# Patient Record
Sex: Male | Born: 1985 | Race: White | Hispanic: No | Marital: Single | Smoking: Current every day smoker
Health system: Southern US, Community
[De-identification: ages and names within clinical notes are randomized; demographics above are authoritative.]

---

## 2006-04-12 ENCOUNTER — Emergency Department (HOSPITAL_COMMUNITY): Admission: EM | Admit: 2006-04-12 | Discharge: 2006-04-12 | Payer: Self-pay | Admitting: Emergency Medicine

## 2006-04-20 ENCOUNTER — Emergency Department (HOSPITAL_COMMUNITY): Admission: EM | Admit: 2006-04-20 | Discharge: 2006-04-20 | Payer: Self-pay | Admitting: Emergency Medicine

## 2020-11-21 ENCOUNTER — Encounter
Admission: EM | Disposition: A | Discharge status: Home / Self Care | Payer: Self-pay | Source: Home / Self Care | Attending: General Surgery

## 2020-11-21 ENCOUNTER — Emergency Department: Payer: BC Managed Care – PPO

## 2020-11-21 ENCOUNTER — Other Ambulatory Visit: Payer: Self-pay

## 2020-11-21 ENCOUNTER — Inpatient Hospital Stay
Admission: EM | Admit: 2020-11-21 | Discharge: 2020-11-23 | DRG: 340 | Disposition: A | Discharge status: Home / Self Care | Payer: BC Managed Care – PPO | Attending: General Surgery | Admitting: General Surgery

## 2020-11-21 ENCOUNTER — Observation Stay: Payer: BC Managed Care – PPO | Admitting: Anesthesiology

## 2020-11-21 ENCOUNTER — Encounter: Payer: Self-pay | Admitting: Emergency Medicine

## 2020-11-21 DIAGNOSIS — E876 Hypokalemia: Secondary | ICD-10-CM | POA: Diagnosis present

## 2020-11-21 DIAGNOSIS — R935 Abnormal findings on diagnostic imaging of other abdominal regions, including retroperitoneum: Secondary | ICD-10-CM | POA: Diagnosis not present

## 2020-11-21 DIAGNOSIS — R1031 Right lower quadrant pain: Secondary | ICD-10-CM

## 2020-11-21 DIAGNOSIS — Z6823 Body mass index (BMI) 23.0-23.9, adult: Secondary | ICD-10-CM

## 2020-11-21 DIAGNOSIS — K3521 Acute appendicitis with generalized peritonitis, with abscess: Secondary | ICD-10-CM | POA: Diagnosis present

## 2020-11-21 DIAGNOSIS — F419 Anxiety disorder, unspecified: Secondary | ICD-10-CM | POA: Diagnosis present

## 2020-11-21 DIAGNOSIS — D72829 Elevated white blood cell count, unspecified: Secondary | ICD-10-CM

## 2020-11-21 DIAGNOSIS — K3580 Unspecified acute appendicitis: Secondary | ICD-10-CM | POA: Diagnosis present

## 2020-11-21 DIAGNOSIS — K381 Appendicular concretions: Secondary | ICD-10-CM | POA: Diagnosis present

## 2020-11-21 DIAGNOSIS — E669 Obesity, unspecified: Secondary | ICD-10-CM | POA: Diagnosis present

## 2020-11-21 DIAGNOSIS — R1084 Generalized abdominal pain: Secondary | ICD-10-CM | POA: Diagnosis not present

## 2020-11-21 DIAGNOSIS — Z20822 Contact with and (suspected) exposure to covid-19: Secondary | ICD-10-CM | POA: Diagnosis present

## 2020-11-21 HISTORY — PX: LAPAROSCOPIC APPENDECTOMY: SHX408

## 2020-11-21 LAB — URINALYSIS, COMPLETE (UACMP) WITH MICROSCOPIC
Bacteria, UA: NONE SEEN
Bilirubin Urine: NEGATIVE
Glucose, UA: NEGATIVE mg/dL
Ketones, ur: 80 mg/dL — AB
Leukocytes,Ua: NEGATIVE
Nitrite: NEGATIVE
Protein, ur: NEGATIVE mg/dL
Specific Gravity, Urine: >1.046 — ABNORMAL HIGH (ref 1.005–1.030)
pH: 6 (ref 5.0–8.0)

## 2020-11-21 LAB — COMPREHENSIVE METABOLIC PANEL
ALT: 25 U/L (ref 0–44)
AST: 19 U/L (ref 15–41)
Albumin: 4.4 g/dL (ref 3.5–5.0)
Alkaline Phosphatase: 54 U/L (ref 38–126)
Anion gap: 14 (ref 5–15)
BUN: 17 mg/dL (ref 6–20)
CO2: 22 mmol/L (ref 22–32)
Calcium: 8.9 mg/dL (ref 8.9–10.3)
Chloride: 102 mmol/L (ref 98–111)
Creatinine, Ser: 0.94 mg/dL (ref 0.61–1.24)
GFR, Estimated: >60 mL/min (ref >60)
Glucose, Bld: 155 mg/dL — ABNORMAL HIGH (ref 70–99)
Potassium: 3.4 mmol/L — ABNORMAL LOW (ref 3.5–5.1)
Sodium: 138 mmol/L (ref 135–145)
Total Bilirubin: 1.3 mg/dL — ABNORMAL HIGH (ref 0.3–1.2)
Total Protein: 7.3 g/dL (ref 6.5–8.1)

## 2020-11-21 LAB — LIPASE, BLOOD: Lipase: 24 U/L (ref 11–51)

## 2020-11-21 LAB — CBC WITH DIFFERENTIAL/PLATELET
Abs Immature Granulocytes: 0.05 10*3/uL (ref 0.00–0.07)
Basophils Absolute: 0.1 10*3/uL (ref 0.0–0.1)
Basophils Relative: 1 %
Eosinophils Absolute: 0.1 10*3/uL (ref 0.0–0.5)
Eosinophils Relative: 1 %
HCT: 47.8 % (ref 39.0–52.0)
Hemoglobin: 16.2 g/dL (ref 13.0–17.0)
Immature Granulocytes: 0 %
Lymphocytes Relative: 13 %
Lymphs Abs: 2.2 10*3/uL (ref 0.7–4.0)
MCH: 30.5 pg (ref 26.0–34.0)
MCHC: 33.9 g/dL (ref 30.0–36.0)
MCV: 89.8 fL (ref 80.0–100.0)
Monocytes Absolute: 0.9 10*3/uL (ref 0.1–1.0)
Monocytes Relative: 5 %
Neutro Abs: 13.6 10*3/uL — ABNORMAL HIGH (ref 1.7–7.7)
Neutrophils Relative %: 80 %
Platelets: 204 10*3/uL (ref 150–400)
RBC: 5.32 MIL/uL (ref 4.22–5.81)
RDW: 13.2 % (ref 11.5–15.5)
WBC: 17 10*3/uL — ABNORMAL HIGH (ref 4.0–10.5)
nRBC: 0 % (ref 0.0–0.2)

## 2020-11-21 LAB — RESP PANEL BY RT-PCR (FLU A&B, COVID) ARPGX2
Influenza A by PCR: NEGATIVE
Influenza B by PCR: NEGATIVE
SARS Coronavirus 2 by RT PCR: NEGATIVE

## 2020-11-21 LAB — HIV ANTIBODY (ROUTINE TESTING W REFLEX): HIV Screen 4th Generation wRfx: NONREACTIVE

## 2020-11-21 SURGERY — APPENDECTOMY, LAPAROSCOPIC
Anesthesia: General

## 2020-11-21 MED ORDER — PROPOFOL 10 MG/ML IV BOLUS
INTRAVENOUS | Status: DC | PRN
  Administered 2020-11-21: 200 mg via INTRAVENOUS

## 2020-11-21 MED ORDER — HYDROMORPHONE HCL 1 MG/ML IJ SOLN
1.0000 mg | Freq: Once | INTRAMUSCULAR | Status: AC
  Administered 2020-11-21: 1 mg via INTRAVENOUS
  Filled 2020-11-21: qty 1

## 2020-11-21 MED ORDER — ONDANSETRON HCL 4 MG/2ML IJ SOLN
4.0000 mg | Freq: Once | INTRAMUSCULAR | Status: AC
  Administered 2020-11-21: 4 mg via INTRAVENOUS
  Filled 2020-11-21: qty 2

## 2020-11-21 MED ORDER — PIPERACILLIN-TAZOBACTAM 3.375 G IVPB
3.3750 g | Freq: Three times a day (TID) | INTRAVENOUS | Status: DC
  Administered 2020-11-21 – 2020-11-23 (×6): 3.375 g via INTRAVENOUS
  Filled 2020-11-21 (×6): qty 50

## 2020-11-21 MED ORDER — FENTANYL CITRATE (PF) 100 MCG/2ML IJ SOLN
INTRAMUSCULAR | Status: DC | PRN
  Administered 2020-11-21 (×2): 50 ug via INTRAVENOUS

## 2020-11-21 MED ORDER — DEXTROSE IN LACTATED RINGERS 5 % IV SOLN: INTRAVENOUS | Status: DC

## 2020-11-21 MED ORDER — DEXAMETHASONE SODIUM PHOSPHATE 10 MG/ML IJ SOLN
INTRAMUSCULAR | Status: AC
  Filled 2020-11-21: qty 1

## 2020-11-21 MED ORDER — PROMETHAZINE HCL 25 MG/ML IJ SOLN: 6.2500 mg | INTRAMUSCULAR | Status: DC | PRN

## 2020-11-21 MED ORDER — SUGAMMADEX SODIUM 200 MG/2ML IV SOLN
INTRAVENOUS | Status: DC | PRN
  Administered 2020-11-21: 150 mg via INTRAVENOUS

## 2020-11-21 MED ORDER — 0.9 % SODIUM CHLORIDE (POUR BTL) OPTIME
TOPICAL | Status: DC | PRN
  Administered 2020-11-21: 500 mL

## 2020-11-21 MED ORDER — PROPOFOL 10 MG/ML IV BOLUS
INTRAVENOUS | Status: AC
  Filled 2020-11-21: qty 20

## 2020-11-21 MED ORDER — HYDROMORPHONE HCL 1 MG/ML IJ SOLN
INTRAMUSCULAR | Status: AC
  Filled 2020-11-21: qty 0.5

## 2020-11-21 MED ORDER — LACTATED RINGERS IV BOLUS
1000.0000 mL | Freq: Once | INTRAVENOUS | Status: AC
  Administered 2020-11-21: 1000 mL via INTRAVENOUS

## 2020-11-21 MED ORDER — KETOROLAC TROMETHAMINE 30 MG/ML IJ SOLN
INTRAMUSCULAR | Status: DC | PRN
  Administered 2020-11-21: 30 mg via INTRAVENOUS

## 2020-11-21 MED ORDER — ROCURONIUM BROMIDE 100 MG/10ML IV SOLN
INTRAVENOUS | Status: DC | PRN
  Administered 2020-11-21: 10 mg via INTRAVENOUS
  Administered 2020-11-21: 40 mg via INTRAVENOUS

## 2020-11-21 MED ORDER — ACETAMINOPHEN 10 MG/ML IV SOLN
INTRAVENOUS | Status: DC | PRN
  Administered 2020-11-21: 1000 mg via INTRAVENOUS

## 2020-11-21 MED ORDER — DEXMEDETOMIDINE (PRECEDEX) IN NS 20 MCG/5ML (4 MCG/ML) IV SYRINGE
PREFILLED_SYRINGE | INTRAVENOUS | Status: AC
  Filled 2020-11-21: qty 5

## 2020-11-21 MED ORDER — DEXAMETHASONE SODIUM PHOSPHATE 10 MG/ML IJ SOLN
INTRAMUSCULAR | Status: DC | PRN
  Administered 2020-11-21: 10 mg via INTRAVENOUS

## 2020-11-21 MED ORDER — IOHEXOL 350 MG/ML SOLN
100.0000 mL | Freq: Once | INTRAVENOUS | Status: AC | PRN
  Administered 2020-11-21: 100 mL via INTRAVENOUS

## 2020-11-21 MED ORDER — HYDROMORPHONE HCL 1 MG/ML IJ SOLN
INTRAMUSCULAR | Status: AC
  Filled 2020-11-21: qty 1

## 2020-11-21 MED ORDER — MIDAZOLAM HCL 2 MG/2ML IJ SOLN
INTRAMUSCULAR | Status: DC | PRN
  Administered 2020-11-21: 2 mg via INTRAVENOUS

## 2020-11-21 MED ORDER — MEPERIDINE HCL 25 MG/ML IJ SOLN: 6.2500 mg | INTRAMUSCULAR | Status: DC | PRN

## 2020-11-21 MED ORDER — LIDOCAINE-EPINEPHRINE 1 %-1:100000 IJ SOLN
INTRAMUSCULAR | Status: AC
  Filled 2020-11-21: qty 1

## 2020-11-21 MED ORDER — KETOROLAC TROMETHAMINE 30 MG/ML IJ SOLN: 30.0000 mg | Freq: Four times a day (QID) | INTRAMUSCULAR | Status: DC | PRN

## 2020-11-21 MED ORDER — LACTATED RINGERS IV SOLN: INTRAVENOUS | Status: DC | PRN

## 2020-11-21 MED ORDER — OXYCODONE HCL 5 MG/5ML PO SOLN: 5.0000 mg | Freq: Once | ORAL | Status: DC | PRN

## 2020-11-21 MED ORDER — LIDOCAINE-EPINEPHRINE 2 %-1:100000 IJ SOLN
INTRAMUSCULAR | Status: AC
  Filled 2020-11-21: qty 1

## 2020-11-21 MED ORDER — IBUPROFEN 400 MG PO TABS: 600.0000 mg | ORAL_TABLET | Freq: Four times a day (QID) | ORAL | Status: DC | PRN

## 2020-11-21 MED ORDER — ONDANSETRON HCL 4 MG/2ML IJ SOLN
INTRAMUSCULAR | Status: DC | PRN
  Administered 2020-11-21: 4 mg via INTRAVENOUS

## 2020-11-21 MED ORDER — LACTATED RINGERS IV SOLN: INTRAVENOUS | Status: DC

## 2020-11-21 MED ORDER — SODIUM CHLORIDE 0.9 % IR SOLN
Status: DC | PRN
  Administered 2020-11-21: 1000 mL
  Administered 2020-11-21: 3000 mL

## 2020-11-21 MED ORDER — ACETAMINOPHEN 10 MG/ML IV SOLN
INTRAVENOUS | Status: AC
  Filled 2020-11-21: qty 100

## 2020-11-21 MED ORDER — ONDANSETRON 4 MG PO TBDP: 4.0000 mg | ORAL_TABLET | Freq: Four times a day (QID) | ORAL | Status: DC | PRN

## 2020-11-21 MED ORDER — OXYCODONE HCL 5 MG PO TABS: 5.0000 mg | ORAL_TABLET | ORAL | Status: DC | PRN

## 2020-11-21 MED ORDER — LIDOCAINE-EPINEPHRINE 1 %-1:100000 IJ SOLN
INTRAMUSCULAR | Status: DC | PRN
  Administered 2020-11-21: 10 mL via INTRADERMAL

## 2020-11-21 MED ORDER — PIPERACILLIN-TAZOBACTAM 3.375 G IVPB 30 MIN
3.3750 g | Freq: Once | INTRAVENOUS | Status: AC
  Administered 2020-11-21: 3.375 g via INTRAVENOUS
  Filled 2020-11-21: qty 50

## 2020-11-21 MED ORDER — HYDROMORPHONE HCL 1 MG/ML IJ SOLN: 1.0000 mg | INTRAMUSCULAR | Status: DC | PRN

## 2020-11-21 MED ORDER — BUPIVACAINE HCL (PF) 0.25 % IJ SOLN
INTRAMUSCULAR | Status: AC
  Filled 2020-11-21: qty 30

## 2020-11-21 MED ORDER — HYDROMORPHONE HCL 1 MG/ML IJ SOLN
0.5000 mg | INTRAMUSCULAR | Status: DC | PRN
  Administered 2020-11-21 (×5): 0.5 mg via INTRAVENOUS
  Filled 2020-11-21 (×4): qty 1

## 2020-11-21 MED ORDER — MIDAZOLAM HCL 2 MG/2ML IJ SOLN
INTRAMUSCULAR | Status: AC
  Filled 2020-11-21: qty 2

## 2020-11-21 MED ORDER — ONDANSETRON HCL 4 MG/2ML IJ SOLN
4.0000 mg | Freq: Four times a day (QID) | INTRAMUSCULAR | Status: DC | PRN
  Administered 2020-11-21: 4 mg via INTRAVENOUS
  Filled 2020-11-21: qty 2

## 2020-11-21 MED ORDER — ACETAMINOPHEN 500 MG PO TABS
1000.0000 mg | ORAL_TABLET | Freq: Four times a day (QID) | ORAL | Status: DC
  Administered 2020-11-22 – 2020-11-23 (×6): 1000 mg via ORAL
  Filled 2020-11-21 (×6): qty 2

## 2020-11-21 MED ORDER — FENTANYL CITRATE (PF) 100 MCG/2ML IJ SOLN
INTRAMUSCULAR | Status: AC
  Filled 2020-11-21: qty 2

## 2020-11-21 MED ORDER — OXYCODONE HCL 5 MG PO TABS: 5.0000 mg | ORAL_TABLET | Freq: Once | ORAL | Status: DC | PRN

## 2020-11-21 MED ORDER — FENTANYL CITRATE (PF) 100 MCG/2ML IJ SOLN: 25.0000 ug | INTRAMUSCULAR | Status: DC | PRN

## 2020-11-21 MED ORDER — ONDANSETRON HCL 4 MG/2ML IJ SOLN
INTRAMUSCULAR | Status: AC
  Filled 2020-11-21: qty 2

## 2020-11-21 MED ORDER — MORPHINE SULFATE (PF) 4 MG/ML IV SOLN
4.0000 mg | Freq: Once | INTRAVENOUS | Status: AC
  Administered 2020-11-21: 4 mg via INTRAVENOUS
  Filled 2020-11-21: qty 1

## 2020-11-21 MED ORDER — HYDROMORPHONE HCL 1 MG/ML IJ SOLN
0.5000 mg | Freq: Once | INTRAMUSCULAR | Status: AC
Start: 2020-11-21 — End: 2020-11-21
  Administered 2020-11-21: 0.5 mg via INTRAVENOUS

## 2020-11-21 MED ORDER — KETOROLAC TROMETHAMINE 30 MG/ML IJ SOLN
INTRAMUSCULAR | Status: AC
  Filled 2020-11-21: qty 1

## 2020-11-21 SURGICAL SUPPLY — 61 items
ADH SKN CLS APL DERMABOND .7 (GAUZE/BANDAGES/DRESSINGS) ×1
APL PRP STRL LF DISP 70% ISPRP (MISCELLANEOUS) ×1
APL SWBSTK 6 STRL LF DISP (MISCELLANEOUS) ×1
APPLICATOR COTTON TIP 6 STRL (MISCELLANEOUS) ×1 IMPLANT
APPLICATOR COTTON TIP 6IN STRL (MISCELLANEOUS) ×2 IMPLANT
APPLIER CLIP 5 13 M/L LIGAMAX5 (MISCELLANEOUS)
APR CLP MED LRG 5 ANG JAW (MISCELLANEOUS)
BAG SPEC RTRVL LRG 6X4 10 (ENDOMECHANICALS) ×1
BLADE CLIPPER SURG (BLADE) IMPLANT
BLADE SURG SZ11 CARB STEEL (BLADE) ×2 IMPLANT
BULB RESERV EVAC DRAIN JP 100C (MISCELLANEOUS) ×2 IMPLANT
CHLORAPREP W/TINT 26 (MISCELLANEOUS) ×2 IMPLANT
CLIP APPLIE 5 13 M/L LIGAMAX5 (MISCELLANEOUS) IMPLANT
CUTTER FLEX LINEAR 45M (STAPLE) ×2 IMPLANT
DEFOGGER SCOPE WARMER CLEARIFY (MISCELLANEOUS) ×2 IMPLANT
DERMABOND ADVANCED (GAUZE/BANDAGES/DRESSINGS) ×1
DERMABOND ADVANCED .7 DNX12 (GAUZE/BANDAGES/DRESSINGS) ×1 IMPLANT
DRAIN CHANNEL JP 15F RND 16 (MISCELLANEOUS) ×2 IMPLANT
ELECT CAUTERY BLADE 6.4 (BLADE) ×2 IMPLANT
ELECT CAUTERY BLADE TIP 2.5 (TIP) ×2
ELECT REM PT RETURN 9FT ADLT (ELECTROSURGICAL) ×2
ELECTRODE CAUTERY BLDE TIP 2.5 (TIP) ×1 IMPLANT
ELECTRODE REM PT RTRN 9FT ADLT (ELECTROSURGICAL) ×1 IMPLANT
GAUZE 4X4 16PLY ~~LOC~~+RFID DBL (SPONGE) ×2 IMPLANT
GLOVE SURG SYN 6.5 ES PF (GLOVE) ×2 IMPLANT
GLOVE SURG UNDER LTX SZ7 (GLOVE) ×2 IMPLANT
GOWN STRL REUS W/ TWL LRG LVL3 (GOWN DISPOSABLE) ×2 IMPLANT
GOWN STRL REUS W/TWL LRG LVL3 (GOWN DISPOSABLE) ×4
GRASPER SUT TROCAR 14GX15 (MISCELLANEOUS) ×2 IMPLANT
IRRIGATION STRYKERFLOW (MISCELLANEOUS) ×1 IMPLANT
IRRIGATOR STRYKERFLOW (MISCELLANEOUS) ×2
IV NS 1000ML (IV SOLUTION) ×2
IV NS 1000ML BAXH (IV SOLUTION) ×1 IMPLANT
KIT TURNOVER KIT A (KITS) ×2 IMPLANT
KITTNER LAPARASCOPIC 5X40 (MISCELLANEOUS) ×2 IMPLANT
LABEL OR SOLS (LABEL) ×2 IMPLANT
MANIFOLD NEPTUNE II (INSTRUMENTS) ×2 IMPLANT
NEEDLE HYPO 22GX1.5 SAFETY (NEEDLE) ×2 IMPLANT
NS IRRIG 500ML POUR BTL (IV SOLUTION) ×2 IMPLANT
PACK LAP CHOLECYSTECTOMY (MISCELLANEOUS) ×2 IMPLANT
PENCIL ELECTRO HAND CTR (MISCELLANEOUS) ×4 IMPLANT
POUCH SPECIMEN RETRIEVAL 10MM (ENDOMECHANICALS) ×2 IMPLANT
RELOAD STAPLE TA45 3.5 REG BLU (ENDOMECHANICALS) ×2 IMPLANT
SCISSORS METZENBAUM CVD 33 (INSTRUMENTS) IMPLANT
SET TUBE SMOKE EVAC HIGH FLOW (TUBING) ×2 IMPLANT
SHEARS HARMONIC ACE PLUS 36CM (ENDOMECHANICALS) ×2 IMPLANT
SLEEVE ADV FIXATION 5X100MM (TROCAR) ×2 IMPLANT
STRIP CLOSURE SKIN 1/2X4 (GAUZE/BANDAGES/DRESSINGS) ×2 IMPLANT
SUT ETHILON 3-0 FS-10 30 BLK (SUTURE) ×2
SUT MNCRL 4-0 (SUTURE) ×2
SUT MNCRL 4-0 27XMFL (SUTURE) ×1
SUT VIC AB 3-0 SH 27 (SUTURE) ×2
SUT VIC AB 3-0 SH 27X BRD (SUTURE) ×1 IMPLANT
SUT VICRYL 0 AB UR-6 (SUTURE) ×2 IMPLANT
SUTURE EHLN 3-0 FS-10 30 BLK (SUTURE) ×1 IMPLANT
SUTURE MNCRL 4-0 27XMF (SUTURE) ×1 IMPLANT
SYS KII FIOS ACCESS ABD 5X100 (TROCAR) ×2
SYSTEM KII FIOS ACES ABD 5X100 (TROCAR) ×1 IMPLANT
TRAY FOLEY MTR SLVR 16FR STAT (SET/KITS/TRAYS/PACK) ×2 IMPLANT
TROCAR ADV FIXATION 12X100MM (TROCAR) IMPLANT
TROCAR BALLN GELPORT 12X130M (ENDOMECHANICALS) ×2 IMPLANT

## 2020-11-21 NOTE — Anesthesia Preprocedure Evaluation (Signed)
Anesthesia Evaluation  Patient identified by MRN, date of birth, ID band Patient awake    Reviewed: Allergy & Precautions, NPO status , Patient's Chart, lab work & pertinent test results  History of Anesthesia Complications Negative for: history of anesthetic complications  Airway Mallampati: II  TM Distance: >3 FB Neck ROM: Full    Dental no notable dental hx.    Pulmonary neg sleep apnea, neg COPD, Current SmokerPatient did not abstain from smoking.,    breath sounds clear to auscultation- rhonchi (-) wheezing      Cardiovascular Exercise Tolerance: Good (-) hypertension(-) CAD and (-) Past MI  Rhythm:Regular Rate:Normal - Systolic murmurs and - Diastolic murmurs    Neuro/Psych negative neurological ROS  negative psych ROS   GI/Hepatic negative GI ROS, Neg liver ROS,   Endo/Other  negative endocrine ROSneg diabetes  Renal/GU negative Renal ROS     Musculoskeletal negative musculoskeletal ROS (+)   Abdominal (+) - obese,   Peds  Hematology negative hematology ROS (+)   Anesthesia Other Findings   Reproductive/Obstetrics                             Anesthesia Physical Anesthesia Plan  ASA: 2  Anesthesia Plan: General   Post-op Pain Management:    Induction: Intravenous  PONV Risk Score and Plan: 1 and Ondansetron and Midazolam  Airway Management Planned: Oral ETT  Additional Equipment:   Intra-op Plan:   Post-operative Plan: Extubation in OR  Informed Consent: I have reviewed the patients History and Physical, chart, labs and discussed the procedure including the risks, benefits and alternatives for the proposed anesthesia with the patient or authorized representative who has indicated his/her understanding and acceptance.     Dental advisory given  Plan Discussed with: CRNA and Anesthesiologist  Anesthesia Plan Comments:         Anesthesia Quick Evaluation

## 2020-11-21 NOTE — Anesthesia Procedure Notes (Signed)
Procedure Name: Intubation Date/Time: 11/21/2020 3:31 PM Performed by: Omer Jack, CRNA Pre-anesthesia Checklist: Patient identified, Patient being monitored, Timeout performed, Emergency Drugs available and Suction available Patient Re-evaluated:Patient Re-evaluated prior to induction Oxygen Delivery Method: Circle system utilized Preoxygenation: Pre-oxygenation with 100% oxygen Induction Type: IV induction Ventilation: Mask ventilation without difficulty Laryngoscope Size: Miller and 2 Grade View: Grade I Tube type: Oral Tube size: 7.5 mm Number of attempts: 2 Airway Equipment and Method: Stylet Placement Confirmation: ETT inserted through vocal cords under direct vision, positive ETCO2 and breath sounds checked- equal and bilateral Secured at: 21 cm Tube secured with: Tape Dental Injury: Teeth and Oropharynx as per pre-operative assessment

## 2020-11-21 NOTE — ED Triage Notes (Signed)
Pt in with co RLQ pain since Friday states became severe 4 hrs pta. Pt is pale and diaphoretic in triage, no hx of the same.

## 2020-11-21 NOTE — ED Notes (Addendum)
EDT made RN aware pt called out for increasing pain and was in guarded position. EDT got updated VS. RN at bedside to administer PRN pain medication. PA made aware of pt's sudden discomfort and concern for rupture.

## 2020-11-21 NOTE — ED Provider Notes (Signed)
[facility] Emergency Department Provider Note  ____________________________________________   Event Date/Time   First MD Initiated Contact with Patient 11/21/20 0116     (approximate)  I have reviewed the triage vital signs and the nursing notes.   HISTORY  Chief Complaint Abdominal Pain    HPI Derek Frank is a 35 y.o. male with anxiety who presents to the emergency department with gradual onset abdominal pain that started on Friday, July 15 and has progressively worsened.  Now describes it as severe, sharp and mostly in the right lower quadrant.  No aggravating relieving factors.  No fevers but has had chills.  Has had nausea and vomiting.  No diarrhea.  No dysuria, hematuria.  No previous history of abdominal surgery.  No history of kidney stones.  Has never had similar symptoms.  No sick contacts, recent travel, hospitalization or antibiotic use.  N.p.o. since around 1 AM.     History reviewed. No pertinent past medical history.  There are no problems to display for this patient.   History reviewed. No pertinent surgical history.  Prior to Admission medications   Not on File    Allergies Patient has no allergy information on record.  History reviewed. No pertinent family history.  Social History    Review of Systems Constitutional: No fever.  + chills. Eyes: No visual changes. ENT: No sore throat. Cardiovascular: Denies chest pain. Respiratory: Denies shortness of breath. Gastrointestinal: + nausea, vomiting.  no diarrhea. Genitourinary: Negative for dysuria. Musculoskeletal: Negative for back pain. Skin: Negative for rash. Neurological: Negative for focal weakness or numbness.  ____________________________________________   PHYSICAL EXAM:  VITAL SIGNS: ED Triage Vitals [11/21/20 0113]  Enc Vitals Group     BP 134/82     Pulse Rate 63     Resp 20     Temp 98.1 F (36.7 C)     Temp Source Oral     SpO2 100 %      Weight 155 lb (70.3 kg)     Height 5\' 8"  (1.727 m)     Head Circumference      Peak Flow      Pain Score 9     Pain Loc      Pain Edu?      Excl. in GC?    CONSTITUTIONAL: Alert and oriented and responds appropriately to questions.  Appears uncomfortable, diaphoretic HEAD: Normocephalic EYES: Conjunctivae clear, pupils appear equal, EOM appear intact ENT: normal nose; moist mucous membranes NECK: Supple, normal ROM CARD: RRR; S1 and S2 appreciated; no murmurs, no clicks, no rubs, no gallops RESP: Normal chest excursion without splinting or tachypnea; breath sounds clear and equal bilaterally; no wheezes, no rhonchi, no rales, no hypoxia or respiratory distress, speaking full sentences ABD/GI: Normal bowel sounds; non-distended; soft, tender to palpation in the right lower quadrant with guarding, no rebound BACK: The back appears normal EXT: Normal ROM in all joints; no deformity noted, no edema; no cyanosis SKIN: Normal color for age and race; warm; no rash on exposed skin NEURO: Moves all extremities equally PSYCH: The patient's mood and manner are appropriate.  ____________________________________________   LABS (all labs ordered are listed, but only abnormal results are displayed)  Labs Reviewed  CBC WITH DIFFERENTIAL/PLATELET - Abnormal; Notable for the following components:      Result Value   WBC 17.0 (*)    Neutro Abs 13.6 (*)    All other components within normal limits  COMPREHENSIVE METABOLIC PANEL -  Abnormal; Notable for the following components:   Potassium 3.4 (*)    Glucose, Bld 155 (*)    Total Bilirubin 1.3 (*)    All other components within normal limits  RESP PANEL BY RT-PCR (FLU A&B, COVID) ARPGX2  LIPASE, BLOOD  URINALYSIS, COMPLETE (UACMP) WITH MICROSCOPIC   ____________________________________________  EKG   ____________________________________________  RADIOLOGY I, Aubriauna Riner, personally viewed and evaluated these images (plain radiographs)  as part of my medical decision making, as well as reviewing the written report by the radiologist.  ED MD interpretation: Acute appendicitis.  Official radiology report(s): CT ABDOMEN PELVIS W CONTRAST  Result Date: 11/21/2020 CLINICAL DATA:  Right lower quadrant abdominal pain EXAM: CT ABDOMEN AND PELVIS WITH CONTRAST TECHNIQUE: Multidetector CT imaging of the abdomen and pelvis was performed using the standard protocol following bolus administration of intravenous contrast. CONTRAST:  OMNIPAQUE IOHEXOL 350 MG/ML SOLN COMPARISON:  None. FINDINGS: Lower chest: No acute abnormality.  Small hiatal hernia. Hepatobiliary: No focal liver abnormality is seen. No gallstones, gallbladder wall thickening, or biliary dilatation. Pancreas: Unremarkable Spleen: Unremarkable Adrenals/Urinary Tract: Adrenal glands are unremarkable. Kidneys are normal, without renal calculi, focal lesion, or hydronephrosis. Bladder is unremarkable. Stomach/Bowel: The appendix is dilated, fluid-filled, and hyperemic, measuring up to 26 mm in diameter at the base. A 19 mm appendicoliths seen within the base of the appendix. There is moderate periappendiceal inflammatory stranding surrounding the base of the appendix. There is adjacent bowel wall thickening involving the cecum adjacent to the base of the appendix, likely reactive. There is no periappendiceal fluid collection identified to suggest perforation. There is, however, mild free fluid seen within the pelvis. No free intraperitoneal gas. The stomach, small bowel, and large bowel are otherwise unremarkable. No evidence of obstruction. Vascular/Lymphatic: No significant vascular findings are present. No enlarged abdominal or pelvic lymph nodes. Reproductive: Prostate is unremarkable. Other: No abdominal wall hernia identified.  Rectum unremarkable. Musculoskeletal: No acute bone abnormality. No lytic or blastic bone lesion identified. IMPRESSION: Acute, unruptured appendicitis.  Appendix: Location: Inferomedial to cecum Diameter: 26 mm Appendicolith: Present, 19 mm Mucosal hyper-enhancement: Present Extraluminal gas: None Periappendiceal collection: None Electronically Signed   By: Helyn Numbers MD   On: 11/21/2020 03:16    ____________________________________________   PROCEDURES  Procedure(s) performed (including Critical Care):  Procedures    ____________________________________________   INITIAL IMPRESSION / ASSESSMENT AND PLAN / ED COURSE  As part of my medical decision making, I reviewed the following data within the electronic MEDICAL RECORD NUMBER Nursing notes reviewed and incorporated, Labs reviewed , Old chart reviewed, Radiograph reviewed , Discussed with admitting physician , and Notes from prior ED visits         Patient here with right lower quadrant abdominal pain.  Differential includes appendicitis, mesenteric adenitis, pyelonephritis, kidney stone, diverticulitis, colitis, bowel obstruction, UTI.  Labs, urine, CT of the abdomen pelvis pending.  Will give IV fluids, pain and nausea medicine.  ED PROGRESS  CT scan shows acute appendicitis without complication.  Will give IV Zosyn and discussed with general surgery.  Patient's pain has been well controlled after morphine, Dilaudid.   3:40 AM  Spoke with Dr. Lady Gary with general surgery.  She will admit patient.  Patient has been updated with plan.  He has been n.p.o. in the ED since arrival.  I reviewed all nursing notes and pertinent previous records as available.  I have reviewed and interpreted any EKGs, lab and urine results, imaging (as available).  ____________________________________________   FINAL  CLINICAL IMPRESSION(S) / ED DIAGNOSES  Final diagnoses:  RLQ abdominal pain  Acute appendicitis without peritonitis     ED Discharge Orders     None       *Please note:  Derek Frank was evaluated in Emergency Department on 11/21/2020 for the symptoms described in the  history of present illness. He was evaluated in the context of the global COVID-19 pandemic, which necessitated consideration that the patient might be at risk for infection with the SARS-CoV-2 virus that causes COVID-19. Institutional protocols and algorithms that pertain to the evaluation of patients at risk for COVID-19 are in a state of rapid change based on information released by regulatory bodies including the CDC and federal and state organizations. These policies and algorithms were followed during the patient's care in the ED.  Some ED evaluations and interventions may be delayed as a result of limited staffing during and the pandemic.*   Note:  This document was prepared using Dragon voice recognition software and may include unintentional dictation errors.    Randall Colden, Layla Maw, DO 11/21/20 [phone]

## 2020-11-21 NOTE — Transfer of Care (Signed)
Immediate Anesthesia Transfer of Care Note  Patient: Derek Frank  Procedure(s) Performed: APPENDECTOMY LAPAROSCOPIC  Patient Location: PACU  Anesthesia Type:General  Level of Consciousness: drowsy and patient cooperative  Airway & Oxygen Therapy: Patient Spontanous Breathing and Patient connected to nasal cannula oxygen  Post-op Assessment: Report given to RN and Post -op Vital signs reviewed and stable  Post vital signs: Reviewed and stable  Last Vitals:  Vitals Value Taken Time  BP 123/67 11/21/20 1636  Temp    Pulse 83 11/21/20 1636  Resp 15 11/21/20 1636  SpO2 97 % 11/21/20 1636  Vitals shown include unvalidated device data.  Last Pain:  Vitals:   11/21/20 1419  TempSrc: Oral  PainSc: 5          Complications: No notable events documented.

## 2020-11-21 NOTE — Op Note (Signed)
Operative Note  Laparoscopic Appendectomy   Mearl Latin Date of operation:  11/21/2020  Indications: The patient presented with a history of  abdominal pain. Workup has revealed findings consistent with acute appendicitis.  Pre-operative Diagnosis: Acute appendicitis with peritoneal abscess, perforation, and generalized peritonitis  Post-operative Diagnosis: Same  Surgeon: Duanne Guess, MD  Anesthesia: GETA  Findings: purulent fluid in pelvis, abscess associated with appendix, perforation, peritonitis  Estimated Blood Loss: <5cc         Specimens: appendix         Complications:  none immediately apparent  Procedure Details  The patient was seen again in the preop area. The options of surgery versus observation were reviewed with the patient and/or family. The risks of bleeding, infection, recurrence of symptoms, negative laparoscopy, potential for an open procedure, bowel injury, abscess or infection, were all reviewed as well. The patient was taken to Operating Room, identified as JAWARA LATORRE and the procedure verified as laparoscopic appendectomy. A time out was performed and the above information confirmed.  The patient was placed in the supine position and general anesthesia was induced.  Antibiotic prophylaxis was administered and VTE prophylaxis was in place.   The abdomen was prepped and draped in a sterile fashion. An infraumbilical incision was made. A cutdown technique was used to enter the abdominal cavity. Two vicryl stitches were placed on the fascia and a Hasson trocar inserted. Pneumoperitoneum obtained. Two 5 mm ports were placed under direct visualization.  The appendix was identified and found to be acutely inflamed with pus in the pelvis and around the appendix.  It was perforated just distal to the base. The appendix was carefully dissected. The mesoappendix was divided with the Harmonic scalpel. The base of the appendix was dissected out and divided  with a standard load Endo GIA x 2. The appendix was placed in a Endo Catch bag and removed via the Hasson port. The right lower quadrant and pelvis was then irrigated with normal saline which was then aspirated. The right lower quadrant was inspected there was no sign of bleeding or bowel injury therefore pneumoperitoneum was released, all ports were removed.  The umbilical fascia was closed with 0 Vicryl interrupted sutures and the skin incisions were approximated with subcuticular 4-0 Monocryl. Dermabond was applied The patient tolerated the procedure well and there were no immediately apparent complications. The sponge lap and needle count were correct at the end of the procedure.  The patient was taken to the recovery room in stable condition to be admitted for continued care.   Duanne Guess, MD, FACS

## 2020-11-21 NOTE — ED Notes (Signed)
Patient to CT at this time

## 2020-11-21 NOTE — H&P (Addendum)
[facility] SURGICAL HISTORY & PHYSICAL (cpt [phone])  HISTORY OF PRESENT ILLNESS (HPI):  35 y.o. male presented to [facility] ED overnight for abdominal pain. Patient reports he initially had the acute onset of relatively generalized soreness around Friday night. However, this pain persisted. He reports that yesterday evening, the pain became sharp, more severe, and localized to his RLQ. He tried to sleep it off but the pain was too severe. He reported associated chills and nausea with the pain. No fever, cough, CP, SOB, emesis, or urinary/bowel changes. No history of similar. No previous intra-abdominal surgery. Work up in the ED was concerning for leukocytosis to 17.0K and mild hypokalemia to 3.4. CT Abdomen/Pelvis is concerning for acute uncomplicated appendicitis   General surgery is consulted by emergency medicine physician Kristen Ward, DO for evaluation and management of acute uncomplicated appendicitis   PAST MEDICAL HISTORY (PMH):  History reviewed. No pertinent past medical history.  Reviewed. Otherwise negative.   PAST SURGICAL HISTORY (PSH):  History reviewed. No pertinent surgical history.  Reviewed. Otherwise negative.   MEDICATIONS:  Prior to Admission medications   Not on File     ALLERGIES:  Not on File   SOCIAL HISTORY:  Social History   Socioeconomic History   Marital status: Single    Spouse name: Not on file   Number of children: Not on file   Years of education: Not on file   Highest education level: Not on file  Occupational History   Not on file  Tobacco Use   Smoking status: Not on file   Smokeless tobacco: Not on file  Substance and Sexual Activity   Alcohol use: Not on file   Drug use: Not on file   Sexual activity: Not on file  Other Topics Concern   Not on file  Social History Narrative   Not on file   Social Determinants of Health   Financial Resource Strain: Not on file  Food Insecurity: Not on file  Transportation Needs: Not  on file  Physical Activity: Not on file  Stress: Not on file  Social Connections: Not on file  Intimate Partner Violence: Not on file     FAMILY HISTORY:  History reviewed. No pertinent family history.  Otherwise negative.   REVIEW OF SYSTEMS:  Review of Systems  Constitutional:  Negative for chills and fever.  HENT:  Negative for congestion and sore throat.   Respiratory:  Negative for cough and shortness of breath.   Cardiovascular:  Negative for chest pain and palpitations.  Gastrointestinal:  Positive for abdominal pain and nausea. Negative for blood in stool, constipation, diarrhea and vomiting.  Genitourinary:  Negative for dysuria and urgency.  Neurological:  Negative for dizziness and headaches.  All other systems reviewed and are negative.  VITAL SIGNS:  Temp:  [98.1 F (36.7 C)] 98.1 F (36.7 C) (07/19 0113) Pulse Rate:  [63-96] 79 (07/19 1000) Resp:  [16-20] 16 (07/19 1000) BP: (120-154)/(65-94) 124/73 (07/19 1000) SpO2:  [92 %-100 %] 93 % (07/19 1000) Weight:  [70.3 kg] 70.3 kg (07/19 0113)     Height: 5\' 8"  (172.7 cm) Weight: 70.3 kg BMI (Calculated): 23.57   PHYSICAL EXAM:  Physical Exam Vitals and nursing note reviewed. Exam conducted with a chaperone present.  Constitutional:      General: He is not in acute distress.    Appearance: He is well-developed and normal weight. He is not ill-appearing.  HENT:     Head: Normocephalic and atraumatic.  Eyes:  General: No scleral icterus.    Extraocular Movements: Extraocular movements intact.  Cardiovascular:     Rate and Rhythm: Normal rate.     Heart sounds: Normal heart sounds. No murmur heard. Pulmonary:     Effort: Pulmonary effort is normal. No respiratory distress.  Abdominal:     General: There is no distension.     Palpations: Abdomen is soft.     Tenderness: There is abdominal tenderness in the right lower quadrant. There is no guarding or rebound.  Genitourinary:    Comments: Deferred Skin:     General: Skin is warm and dry.     Coloration: Skin is not jaundiced or pale.  Neurological:     General: No focal deficit present.     Mental Status: He is alert and oriented to person, place, and time.  Psychiatric:        Mood and Affect: Mood normal.        Behavior: Behavior normal.    INTAKE/OUTPUT:  This shift: No intake/output data recorded.  Last 2 shifts: @IOLAST2SHIFTS @  Labs:  CBC Latest Ref Rng & Units 11/21/2020  WBC 4.0 - 10.5 K/uL 17.0(H)  Hemoglobin 13.0 - 17.0 g/dL 11/23/2020  Hematocrit 75.1 - 52.0 % 47.8  Platelets 150 - 400 K/uL 204   CMP Latest Ref Rng & Units 11/21/2020  Glucose 70 - 99 mg/dL 11/23/2020)  BUN 6 - 20 mg/dL 17  Creatinine 174(B - 4.49 mg/dL 6.75  Sodium 9.16 - 384 mmol/L 138  Potassium 3.5 - 5.1 mmol/L 3.4(L)  Chloride 98 - 111 mmol/L 102  CO2 22 - 32 mmol/L 22  Calcium 8.9 - 10.3 mg/dL 8.9  Total Protein 6.5 - 8.1 g/dL 7.3  Total Bilirubin 0.3 - 1.2 mg/dL 665)  Alkaline Phos 38 - 126 U/L 54  AST 15 - 41 U/L 19  ALT 0 - 44 U/L 25     Imaging studies:   CT Abdomen/Pelvis (11/21/2020) personally reviewed which shows inflammatory changes surrounding the appendix, appendicolith present, no abscess or pneumoperitoneum, and radiologist report reviewed below:  IMPRESSION: Acute, unruptured appendicitis.   Appendix: Location: Inferomedial to cecum   Diameter: 26 mm   Appendicolith: Present, 19 mm   Mucosal hyper-enhancement: Present   Extraluminal gas: None   Periappendiceal collection: None   Assessment/Plan: (ICD-10's: K35.80) 35 y.o. male with leukocytosis and abdominal pain concerning for acute uncomplicated appendicitis.    - Will admit to general surgery - Will plan for laparoscopic appendectomy pending OR/Anesthesia availability - All risks, benefits, and alternatives to above procedure(s) were discussed with the patient, all of his questions were answered to his expressed satisfaction, patient expresses he wishes to proceed, and  informed consent was obtained.    - NPO + IVF Resuscitation  - IV Abx (Zosyn)  - Monitor abdominal examination - Pain control prn; antiemetics prn   - DVT prophylaxis; hold for OR  All of the above findings and recommendations were discussed with the patient, and all of his questions were answered to her expressed satisfaction.  -- 30, PA-C [facility] 11/21/2020, 10:34 AM [phone] M-F: 7am - 4pm  I saw and evaluated the patient.  I agree with the above documentation, exam, and plan, which I have edited where appropriate. [phone]  1:49 PM

## 2020-11-21 NOTE — ED Notes (Signed)
Patient returned from CT at this time.

## 2020-11-21 NOTE — ED Notes (Signed)
Pt visualized resting with eyes closed. Respirations even and unlabored. Lights dimmed to promote pt comfort.

## 2020-11-22 ENCOUNTER — Encounter: Payer: Self-pay | Admitting: General Surgery

## 2020-11-22 LAB — CBC
HCT: 40.7 % (ref 39.0–52.0)
Hemoglobin: 14 g/dL (ref 13.0–17.0)
MCH: 30 pg (ref 26.0–34.0)
MCHC: 34.4 g/dL (ref 30.0–36.0)
MCV: 87.2 fL (ref 80.0–100.0)
Platelets: 172 10*3/uL (ref 150–400)
RBC: 4.67 MIL/uL (ref 4.22–5.81)
RDW: 13.5 % (ref 11.5–15.5)
WBC: 19 10*3/uL — ABNORMAL HIGH (ref 4.0–10.5)
nRBC: 0 % (ref 0.0–0.2)

## 2020-11-22 LAB — BASIC METABOLIC PANEL
Anion gap: 9 (ref 5–15)
BUN: 9 mg/dL (ref 6–20)
CO2: 26 mmol/L (ref 22–32)
Calcium: 8.7 mg/dL — ABNORMAL LOW (ref 8.9–10.3)
Chloride: 102 mmol/L (ref 98–111)
Creatinine, Ser: 0.72 mg/dL (ref 0.61–1.24)
GFR, Estimated: >60 mL/min (ref >60)
Glucose, Bld: 164 mg/dL — ABNORMAL HIGH (ref 70–99)
Potassium: 3.6 mmol/L (ref 3.5–5.1)
Sodium: 137 mmol/L (ref 135–145)

## 2020-11-22 LAB — PHOSPHORUS: Phosphorus: 2.5 mg/dL (ref 2.5–4.6)

## 2020-11-22 LAB — MAGNESIUM: Magnesium: 2 mg/dL (ref 1.7–2.4)

## 2020-11-22 MED ORDER — ESCITALOPRAM OXALATE 20 MG PO TABS
20.0000 mg | ORAL_TABLET | Freq: Every day | ORAL | Status: DC
  Administered 2020-11-22 – 2020-11-23 (×2): 20 mg via ORAL
  Filled 2020-11-22 (×2): qty 1

## 2020-11-22 NOTE — Anesthesia Postprocedure Evaluation (Signed)
Anesthesia Post Note  Patient: Derek Frank  Procedure(s) Performed: APPENDECTOMY LAPAROSCOPIC  Patient location during evaluation: PACU Anesthesia Type: General Level of consciousness: awake and alert and oriented Pain management: pain level controlled Vital Signs Assessment: post-procedure vital signs reviewed and stable Respiratory status: spontaneous breathing, nonlabored ventilation and respiratory function stable Cardiovascular status: blood pressure returned to baseline and stable Postop Assessment: no signs of nausea or vomiting Anesthetic complications: no   No notable events documented.   Last Vitals:  Vitals:   11/21/20 2141 11/22/20 0400  BP: 109/60 111/60  Pulse: 62 61  Resp: 16 18  Temp: 36.9 C 36.7 C  SpO2: 97% 97%    Last Pain:  Vitals:   11/22/20 0559  TempSrc:   PainSc: 0-No pain                 Philicia Heyne

## 2020-11-22 NOTE — Progress Notes (Signed)
[facility] SUR[facility]ICAL PROGRESS NOTE  Hospital Day(s): 1.   Post op day(s): 1 Day Post-Op.   Interval History:  Patient seen and examined No acute events or new complaints overnight.  Patient reports he feels better overall compared to presentation No fever, chills, nausea, emesis Slight bump in leukocytosis; likely reactive from OR/degree of purulent appendicitis Renal function remains normal; sCr - 0.72; UO - 750 ccs No electrolyte derangements Surgical drain with 310 out; Serosanguinous  He has been on CLD since surgery  Vital signs in last 24 hours: [min-max] current  Temp:  [98 F (36.7 C)-99.7 F (37.6 C)] 98 F (36.7 C) (07/20 0400) Pulse Rate:  [59-95] 61 (07/20 0400) Resp:  [9-18] 18 (07/20 0400) BP: (105-146)/(59-86) 111/60 (07/20 0400) SpO2:  [91 %-100 %] 97 % (07/20 0400) Weight:  [70.3 kg] 70.3 kg (07/19 1419)     Height: 5\' 8"  (172.7 cm) Weight: 70.3 kg BMI (Calculated): 23.57   Intake/Output last 2 shifts:  07/19 0701 - 07/20 0700 In: 2787.4 [P.O.:300; I.V.:2300; IV Piggyback:187.4] Out: 1065 [Urine:750; Drains:310; Blood:5]   Physical Exam:  Constitutional: alert, cooperative and no distress  Respiratory: breathing non-labored at rest  Cardiovascular: regular rate and sinus rhythm  Gastrointestinal: Soft, incisional soreness, non-distended, no rebound/guarding. Surgical drain in left abdomen; output serosanguinous Integumentary: Laparoscopic incisions are CDI with steri-strips no erythema or drainage   Labs:  CBC Latest Ref Rng & Units 11/22/2020 11/21/2020  WBC 4.0 - 10.5 K/uL 19.0(H) 17.0(H)  Hemoglobin 13.0 - 17.0 g/dL 11/23/2020 67.2  Hematocrit 09.4 - 52.0 % 40.7 47.8  Platelets 150 - 400 K/uL 172 204   CMP Latest Ref Rng & Units 11/21/2020  Glucose 70 - 99 mg/dL 11/23/2020)  BUN 6 - 20 mg/dL 17  Creatinine 628(Z - 6.62 mg/dL 9.47  Sodium 6.54 - 650 mmol/L 138  Potassium 3.5 - 5.1 mmol/L 3.4(L)  Chloride 98 - 111 mmol/L 102  CO2 22 - 32  mmol/L 22  Calcium 8.9 - 10.3 mg/dL 8.9  Total Protein 6.5 - 8.1 g/dL 7.3  Total Bilirubin 0.3 - 1.2 mg/dL 354)  Alkaline Phos 38 - 126 U/L 54  AST 15 - 41 U/L 19  ALT 0 - 44 U/L 25     Imaging studies: No new pertinent imaging studies   Assessment/Plan: 35 y.o. male  1 Day Post-Op s/p laparoscopic appendectomy for acute appendicitis with peritoneal abscess, perforation, and generalized peritonitis   - Okay to advance diet as tolerated today   - Wean from IVF as diet advances   - Continue IV Abx (Zosyn); will need PO at discharge - Monitor abdominal examination; on-going bowel function - Pain control prn; antiemetics prn - Monitor leukocytosis; morning labs   - Mobilization as tolerated  - Discharge Planning; He will benefit from additional 24 hours for IV Abx, anticipate DC in the morning pending clinical condition    All of the above findings and recommendations were discussed with the patient, and the medical team, and all of patient's questions were answered to his expressed satisfaction.  -- 31, PA-C [facility] 11/22/2020, 7:17 AM [phone] M-F: 7am - 4pm

## 2020-11-23 LAB — CBC
HCT: 38.3 % — ABNORMAL LOW (ref 39.0–52.0)
Hemoglobin: 13 g/dL (ref 13.0–17.0)
MCH: 29.5 pg (ref 26.0–34.0)
MCHC: 33.9 g/dL (ref 30.0–36.0)
MCV: 87 fL (ref 80.0–100.0)
Platelets: 165 10*3/uL (ref 150–400)
RBC: 4.4 MIL/uL (ref 4.22–5.81)
RDW: 13.4 % (ref 11.5–15.5)
WBC: 14.7 10*3/uL — ABNORMAL HIGH (ref 4.0–10.5)
nRBC: 0 % (ref 0.0–0.2)

## 2020-11-23 LAB — BASIC METABOLIC PANEL
Anion gap: 10 (ref 5–15)
BUN: 9 mg/dL (ref 6–20)
CO2: 27 mmol/L (ref 22–32)
Calcium: 8.4 mg/dL — ABNORMAL LOW (ref 8.9–10.3)
Chloride: 101 mmol/L (ref 98–111)
Creatinine, Ser: 0.78 mg/dL (ref 0.61–1.24)
GFR, Estimated: >60 mL/min (ref >60)
Glucose, Bld: 123 mg/dL — ABNORMAL HIGH (ref 70–99)
Potassium: 3.3 mmol/L — ABNORMAL LOW (ref 3.5–5.1)
Sodium: 138 mmol/L (ref 135–145)

## 2020-11-23 LAB — SURGICAL PATHOLOGY

## 2020-11-23 MED ORDER — IBUPROFEN 600 MG PO TABS: 600.0000 mg | ORAL_TABLET | Freq: Four times a day (QID) | ORAL | 0 refills | Status: AC | PRN

## 2020-11-23 MED ORDER — OXYCODONE HCL 5 MG PO TABS: 5.0000 mg | ORAL_TABLET | Freq: Four times a day (QID) | ORAL | 0 refills | Status: DC | PRN

## 2020-11-23 MED ORDER — AMOXICILLIN-POT CLAVULANATE 875-125 MG PO TABS: 1.0000 | ORAL_TABLET | Freq: Two times a day (BID) | ORAL | 0 refills | Status: AC

## 2020-11-23 NOTE — Discharge Instructions (Signed)
In addition to included general post-operative instructions,  Diet: Resume home diet.   Activity: No heavy lifting >20 pounds (children, pets, laundry, garbage) or strenuous activity for 4 weeks, but light activity and walking are encouraged. Do not drive or drink alcohol if taking narcotic pain medications or having pain that might distract from driving.  Wound care: If you can keep drain site water proofed, you may shower/get incision wet with soapy water and pat dry (do not rub incisions), but no baths or submerging incision underwater until follow-up.   Drain: Monitor and record output daily. You should have handouts for this in discharge paperwork   Medications: Resume all home medications. For mild to moderate pain: acetaminophen (Tylenol) or ibuprofen/naproxen (if no kidney disease). Combining Tylenol with alcohol can substantially increase your risk of causing liver disease. Narcotic pain medications, if prescribed, can be used for severe pain, though may cause nausea, constipation, and drowsiness. Do not combine Tylenol and Percocet (or similar) within a 6 hour period as Percocet (and similar) contain(s) Tylenol. If you do not need the narcotic pain medication, you do not need to fill the prescription.  Call office [phone] / [phone]) at any time if any questions, worsening pain, fevers/chills, bleeding, drainage from incision site, or other concerns.

## 2020-11-23 NOTE — Discharge Summary (Addendum)
[facility] SURGICAL DISCHARGE SUMMARY  Patient ID: Derek Frank MRN: [id number] DOB/AGE: Aug 15, 1985 35 y.o.  Admit date: 11/21/2020 Discharge date: 11/23/2020  Discharge Diagnoses Patient Active Problem List   Diagnosis Date Noted   Acute appendicitis 11/21/2020    Consultants None  Procedures 11/21/2020 Laparoscopic Appendectomy  HPI: 35 y.o. male presented to [facility] ED overnight for abdominal pain. Patient reports he initially had the acute onset of relatively generalized soreness around Friday night. However, this pain persisted. He reports that yesterday evening, the pain became sharp, more severe, and localized to his RLQ. He tried to sleep it off but the pain was too severe. He reported associated chills and nausea with the pain. No fever, cough, CP, SOB, emesis, or urinary/bowel changes. No history of similar. No previous intra-abdominal surgery. Work up in the ED was concerning for leukocytosis to 17.0K and mild hypokalemia to 3.4. CT Abdomen/Pelvis is concerning for acute uncomplicated appendicitis   Hospital Course: Informed consent was obtained and documented, and patient underwent uneventful laparoscopic appendectomy (Dr Lady Gary, 11/21/2020).  Post-operatively, patient's pain and symptoms improved/resolved and advancement of patient's diet and ambulation were well-tolerated. The remainder of patient's hospital course was essentially unremarkable, and discharge planning was initiated accordingly with patient safely able to be discharged home with appropriate discharge instructions, antibiotics (Augmentin x7 days), pain control, and outpatient follow-up after all of his questions were answered to his expressed satisfaction.   Discharge Condition: Good   Physical Examination:  Constitutional: alert, cooperative and no distress Respiratory: breathing non-labored at rest Cardiovascular: regular rate and sinus rhythm Gastrointestinal: Soft, incisional soreness  improved, non-distended, no rebound/guarding. Surgical drain in left abdomen; output serosanguinous Integumentary: Laparoscopic incisions are CDI with steri-strips no erythema or drainage    Allergies as of 11/23/2020   Not on File      Medication List     TAKE these medications    ALPRAZolam 0.5 MG tablet Commonly known as: XANAX Take 0.5-1 mg by mouth 2 (two) times daily as needed.   amoxicillin-clavulanate 875-125 MG tablet Commonly known as: Augmentin Take 1 tablet by mouth 2 (two) times daily for 7 days.   escitalopram 20 MG tablet Commonly known as: LEXAPRO Take 20 mg by mouth daily.   ibuprofen 600 MG tablet Commonly known as: ADVIL Take 1 tablet (600 mg total) by mouth every 6 (six) hours as needed for mild pain.   oxyCODONE 5 MG immediate release tablet Commonly known as: Oxy IR/ROXICODONE Take 1 tablet (5 mg total) by mouth every 6 (six) hours as needed for severe pain or breakthrough pain.          Follow-up Information     Donovan Kail, PA-C. Schedule an appointment as soon as possible for a visit in 1 week(s).   Specialty: Physician Assistant Why: s/p laparoscopic appendectomy, with drain Contact information: [address] [place] [address] [phone]                  Time spent on discharge management including discussion of hospital course, clinical condition, outpatient instructions, prescriptions, and follow up with the patient and members of the medical team: >30 minutes  -- Lynden Oxford , PA-C [facility]  11/23/2020, 9:38 AM [phone] M-F: 7am - 4pm  Patient departed the facility prior to my evaluation. I discussed his care with [name] and reviewed labs/vital signs via the EMR. I agree with the above documentation.

## 2020-11-23 NOTE — Plan of Care (Signed)
  Problem: Health Behavior/Discharge Planning: Goal: Ability to manage health-related needs will improve Outcome: Adequate for Discharge   Problem: Clinical Measurements: Goal: Ability to maintain clinical measurements within normal limits will improve Outcome: Adequate for Discharge Goal: Will remain free from infection Outcome: Adequate for Discharge Goal: Diagnostic test results will improve Outcome: Adequate for Discharge Goal: Respiratory complications will improve Outcome: Adequate for Discharge Goal: Cardiovascular complication will be avoided Outcome: Adequate for Discharge   Problem: Activity: Goal: Risk for activity intolerance will decrease Outcome: Adequate for Discharge   Problem: Pain Managment: Goal: General experience of comfort will improve Outcome: Adequate for Discharge   Problem: Safety: Goal: Ability to remain free from injury will improve Outcome: Adequate for Discharge   

## 2020-11-29 ENCOUNTER — Ambulatory Visit (INDEPENDENT_AMBULATORY_CARE_PROVIDER_SITE_OTHER): Payer: BC Managed Care – PPO | Admitting: Physician Assistant

## 2020-11-29 ENCOUNTER — Encounter: Payer: Self-pay | Admitting: Physician Assistant

## 2020-11-29 ENCOUNTER — Other Ambulatory Visit: Payer: Self-pay

## 2020-11-29 VITALS — BP 127/75 | HR 81 | Temp 98.3°F | Ht 68.0 in | Wt 156.0 lb

## 2020-11-29 DIAGNOSIS — Z09 Encounter for follow-up examination after completed treatment for conditions other than malignant neoplasm: Secondary | ICD-10-CM

## 2020-11-29 DIAGNOSIS — K353 Acute appendicitis with localized peritonitis, without perforation or gangrene: Secondary | ICD-10-CM

## 2020-11-29 NOTE — Progress Notes (Signed)
[facility] POST-OP OFFICE VISIT  11/29/2020  HPI: Derek Frank is a 35 y.o. male 6 days s/p laparoscopic appendectomy with for acute appendicitis with Dr Lady Gary.   He has done very well at home No issues with abdominal pain, fever, chills, nausea, emesis His drain has been putting out about 20 ccs or less daily; this has been serous No issues with PO intake No issues with incisions Overall doing well   Vital signs: BP 127/75   Pulse 81   Temp 98.3 F (36.8 C)   Ht 5\' 8"  (1.727 m)   Wt 156 lb (70.8 kg)   SpO2 98%   BMI 23.72 kg/m    Physical Exam: Constitutional: Well appearing male, NAD Abdomen: Soft, non-tender, non-distended, no rebound/guarding. Surgical drain in LLQ with minimal serous output Skin: Laparoscopic incisions are CDI with steri-strips, no erythema or drainage   Assessment/Plan: This is a 35 y.o. male 6 days s/p laparoscopic appendectomy with for acute appendicitis    - Removed drain at bedside; placed occlusive dressing  - Reviewed wound care  - Reviewed lifting restrictions  - Reviewed pathology: Appendicitis with serositis, negative for malignancy   - He can follow up in 2 weeks for final recheck  -- 31, PA-C [facility] 11/29/2020, 11:34 AM [phone] M-F: 7am - 4pm

## 2020-11-29 NOTE — Patient Instructions (Signed)
GENERAL POST-OPERATIVE PATIENT INSTRUCTIONS   WOUND CARE INSTRUCTIONS:  Keep a dry clean dressing on the wound if there is drainage. The initial bandage may be removed after 24 hours.  Once the wound has quit draining you may leave it open to air.  If clothing rubs against the wound or causes irritation and the wound is not draining you may cover it with a dry dressing during the daytime.  Try to keep the wound dry and avoid ointments on the wound unless directed to do so.  If the wound becomes bright red and painful or starts to drain infected material that is not clear, please contact your physician immediately.  If the wound is mildly pink and has a thick firm ridge underneath it, this is normal, and is referred to as a healing ridge.  This will resolve over the next 4-6 weeks.  BATHING: You may shower if you have been informed of this by your surgeon. However, Please do not submerge in a tub, hot tub, or pool until incisions are completely sealed or have been told by your surgeon that you may do so.  DIET:  You may eat any foods that you can tolerate.  It is a good idea to eat a high fiber diet and take in plenty of fluids to prevent constipation.  If you do become constipated you may want to take a mild laxative or take ducolax tablets on a daily basis until your bowel habits are regular.  Constipation can be very uncomfortable, along with straining, after recent surgery.  ACTIVITY:  You are encouraged to cough and deep breath or use your incentive spirometer if you were given one, every 15-30 minutes when awake.  This will help prevent respiratory complications and low grade fevers post-operatively if you had a general anesthetic.  You may want to hug a pillow when coughing and sneezing to add additional support to the surgical area, if you had abdominal or chest surgery, which will decrease pain during these times.  You are encouraged to walk and engage in light activity for the next two weeks.  You  should not lift more than 20 pounds as it could put you at increased risk for complications.  Twenty pounds is roughly equivalent to a plastic bag of groceries. At that time- Listen to your body when lifting, if you have pain when lifting, stop and then try again in a few days. Soreness after doing exercises or activities of daily living is normal as you get back in to your normal routine.  MEDICATIONS:  Try to take narcotic medications and anti-inflammatory medications, such as tylenol, ibuprofen, naprosyn, etc., with food.  This will minimize stomach upset from the medication.  Should you develop nausea and vomiting from the pain medication, or develop a rash, please discontinue the medication and contact your physician.  You should not drive, make important decisions, or operate machinery when taking narcotic pain medication.  SUNBLOCK Use sun block to incision area over the next year if this area will be exposed to sun. This helps decrease scarring and will allow you avoid a permanent darkened area over your incision.  QUESTIONS:  Please feel free to call our office if you have any questions, and we will be glad to assist you. [phone]    

## 2020-12-04 ENCOUNTER — Telehealth: Payer: Self-pay

## 2020-12-04 ENCOUNTER — Telehealth: Payer: Self-pay | Admitting: General Surgery

## 2020-12-04 MED ORDER — AMOXICILLIN-POT CLAVULANATE 875-125 MG PO TABS: 1.0000 | ORAL_TABLET | Freq: Two times a day (BID) | ORAL | 0 refills | Status: AC

## 2020-12-04 NOTE — Telephone Encounter (Signed)
Spoke with patient-he states his incision has redness and drainage has foul odor- Augmentin sent to pharmacy-patient denies fever-no pain. Keep scheduled appointment 12/05/2020

## 2020-12-04 NOTE — Telephone Encounter (Signed)
Patient called and is asking for one of the nurses to give him a call has some questions. Please call patient and advise.

## 2020-12-04 NOTE — Telephone Encounter (Signed)
Spoke with the patient and he was walking his dog Friday evening and the dog took off and drug him a short distance. He now has some redness at his incision near the belly button and has a lump that knot that has formed near it. He will come in tomorrow to be seen.

## 2020-12-05 ENCOUNTER — Other Ambulatory Visit: Payer: Self-pay

## 2020-12-05 ENCOUNTER — Ambulatory Visit (INDEPENDENT_AMBULATORY_CARE_PROVIDER_SITE_OTHER): Payer: BC Managed Care – PPO | Admitting: General Surgery

## 2020-12-05 ENCOUNTER — Encounter: Payer: Self-pay | Admitting: General Surgery

## 2020-12-05 VITALS — BP 128/83 | HR 73 | Temp 98.6°F | Ht 68.0 in | Wt 152.6 lb

## 2020-12-05 DIAGNOSIS — Z09 Encounter for follow-up examination after completed treatment for conditions other than malignant neoplasm: Secondary | ICD-10-CM

## 2020-12-05 NOTE — Patient Instructions (Addendum)
Please finish your antibiotic.   If you have any concerns or questions, please feel free to call our office.    GENERAL POST-OPERATIVE PATIENT INSTRUCTIONS   WOUND CARE INSTRUCTIONS:  Keep a dry clean dressing on the wound if there is drainage. The initial bandage may be removed after 24 hours.  Once the wound has quit draining you may leave it open to air.  If clothing rubs against the wound or causes irritation and the wound is not draining you may cover it with a dry dressing during the daytime.  Try to keep the wound dry and avoid ointments on the wound unless directed to do so.  If the wound becomes bright red and painful or starts to drain infected material that is not clear, please contact your physician immediately.  If the wound is mildly pink and has a thick firm ridge underneath it, this is normal, and is referred to as a healing ridge.  This will resolve over the next 4-6 weeks.  BATHING: You may shower if you have been informed of this by your surgeon. However, Please do not submerge in a tub, hot tub, or pool until incisions are completely sealed or have been told by your surgeon that you may do so.  DIET:  You may eat any foods that you can tolerate.  It is a good idea to eat a high fiber diet and take in plenty of fluids to prevent constipation.  If you do become constipated you may want to take a mild laxative or take ducolax tablets on a daily basis until your bowel habits are regular.  Constipation can be very uncomfortable, along with straining, after recent surgery.  ACTIVITY:  You are encouraged to cough and deep breath or use your incentive spirometer if you were given one, every 15-30 minutes when awake.  This will help prevent respiratory complications and low grade fevers post-operatively if you had a general anesthetic.  You may want to hug a pillow when coughing and sneezing to add additional support to the surgical area, if you had abdominal or chest surgery, which will  decrease pain during these times.  You are encouraged to walk and engage in light activity for the next two weeks.  You should not lift more than 10 pounds, until 12/19/2020 as it could put you at increased risk for complications.  Twenty pounds is roughly equivalent to a plastic bag of groceries. At that time- Listen to your body when lifting, if you have pain when lifting, stop and then try again in a few days. Soreness after doing exercises or activities of daily living is normal as you get back in to your normal routine.  MEDICATIONS:  Try to take narcotic medications and anti-inflammatory medications, such as tylenol, ibuprofen, naprosyn, etc., with food.  This will minimize stomach upset from the medication.  Should you develop nausea and vomiting from the pain medication, or develop a rash, please discontinue the medication and contact your physician.  You should not drive, make important decisions, or operate machinery when taking narcotic pain medication.  SUNBLOCK Use sun block to incision area over the next year if this area will be exposed to sun. This helps decrease scarring and will allow you avoid a permanent darkened area over your incision.

## 2020-12-05 NOTE — Progress Notes (Signed)
Derek Frank is here today for a wound check.  He is a 35 year old man who underwent a laparoscopic appendectomy on November 21, 2020.  He had perforation with abscess formation and generalized peritonitis.  A drain was left in situ.  He saw our physicians assistant on November 29, 2020 and his drain was removed at that time.  He was scheduled to follow-up with [name] on August 11, but yesterday, he was walking his dog and she apparently pulled hard, after which he felt a popping sensation at his umbilical incision and noticed redness with some drainage from the site.  One of the other providers in our office called in a prescription for Augmentin and he was scheduled here for evaluation today.  He denies any fevers or chills.  No nausea or vomiting.  Appetite and bowel function are normal.  Today's Vitals   12/05/20 1008  BP: 128/83  Pulse: 73  Temp: 98.6 F (37 C)  TempSrc: Oral  SpO2: 98%  Weight: 152 lb 9.6 oz (69.2 kg)  Height: 5\' 8"  (1.727 m)   Body mass index is 23.2 kg/m. Focused abdominal examination: Steri-Strips were still in place at his suprapubic and umbilical incision.  The drain site has a scab which was easily removed.  I removed the Steri-Strips.  The suprapubic incision is healing nicely without any erythema, induration, or drainage.  On the other hand, the umbilical site is indurated and red with some thick seropurulent fluid.  Impression and plan: This is a 35 year old man who had purulent appendicitis.  He now has an umbilical wound infection.  He has already been prescribed Augmentin and began taking it yesterday.  I have encouraged him to complete the course of antibiotics.  If the site resolves on the antibiotics, there is no need for additional follow-up, but I have asked him to be in touch if there is no resolution and we may need to pursue alternative interventions.

## 2020-12-14 ENCOUNTER — Encounter: Payer: BC Managed Care – PPO | Admitting: Physician Assistant

## 2021-01-26 ENCOUNTER — Encounter: Payer: Self-pay | Admitting: General Surgery

## 2023-02-18 IMAGING — CT CT ABD-PELV W/ CM
2 of 4 series · 16 of 46 positions shown, 18 images · IV contrast (omnipaque)
Comparison: None.

CLINICAL DATA: Right lower quadrant abdominal pain

EXAM:
CT ABDOMEN AND PELVIS WITH CONTRAST
TECHNIQUE: Multidetector CT imaging of the abdomen and pelvis was performed
using the standard protocol following bolus administration of
intravenous contrast.
CONTRAST:  100mL OMNIPAQUE IOHEXOL 350 MG/ML SOLN

[Series 2: routine abd/pel with · axial · 0.72mm/px · z∈[-1270,-830]mm · 13 of 98 slices shown, 15 images]
[im 5/98  soft-tissue]
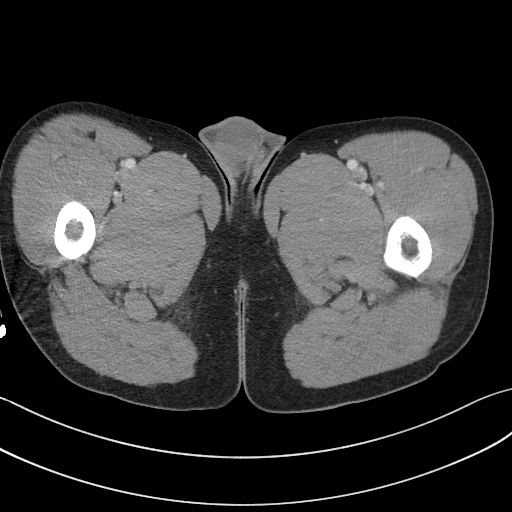
[im 5/98  bone]
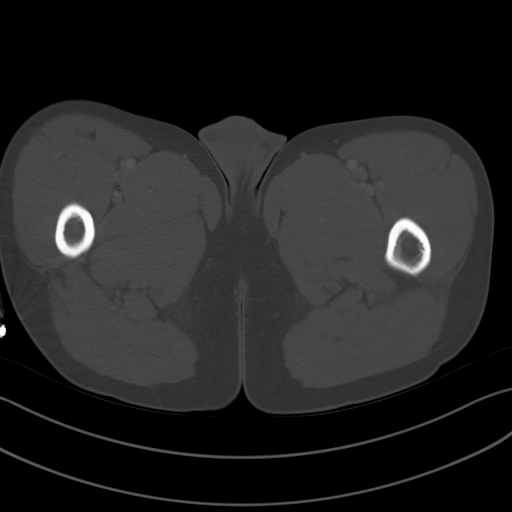
[im 13/98  soft-tissue]
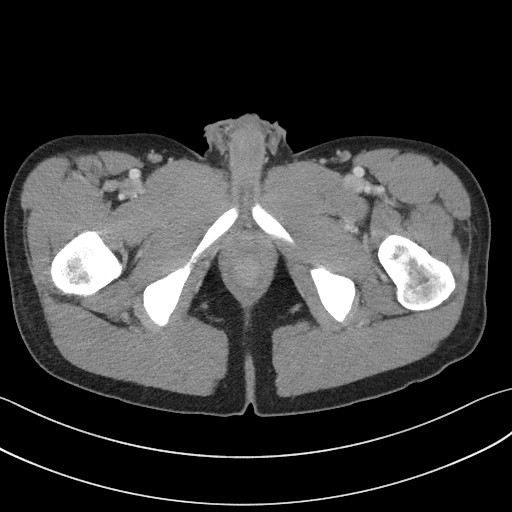
[im 22/98  soft-tissue]
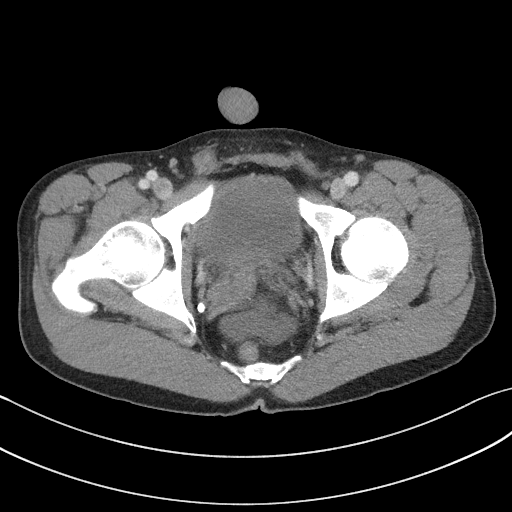
[im 26/98  soft-tissue]
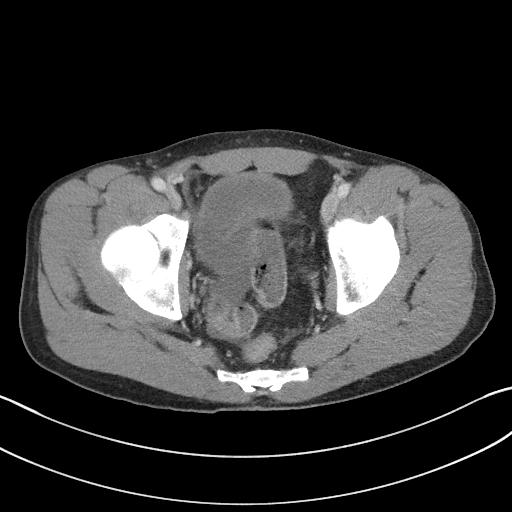
[im 34/98  soft-tissue]
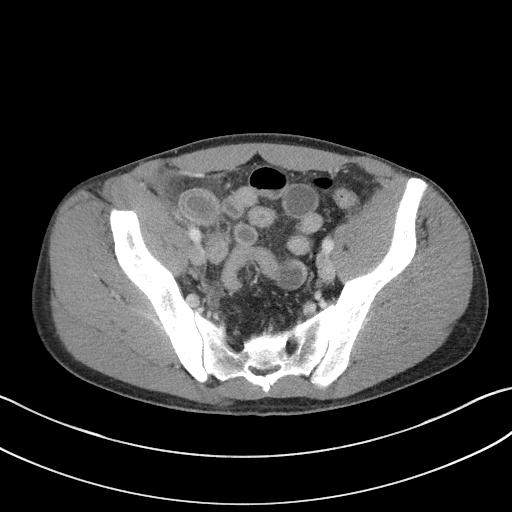
[im 43/98  soft-tissue]
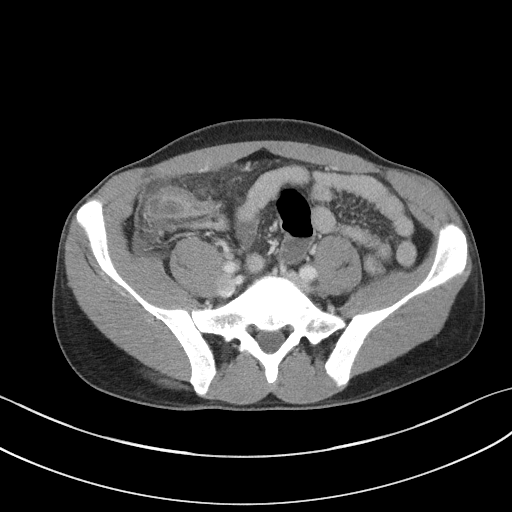
[im 51/98  soft-tissue]
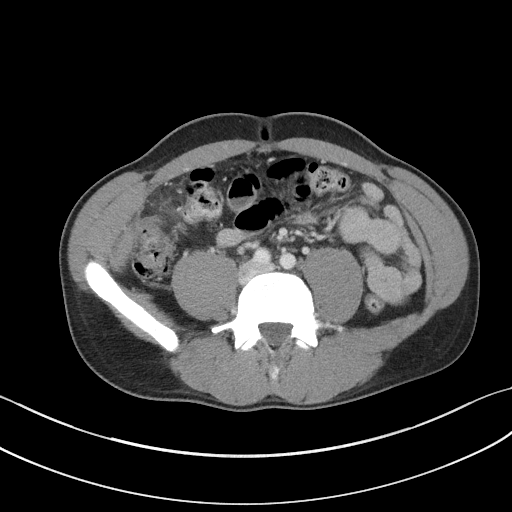
[im 55/98  soft-tissue]
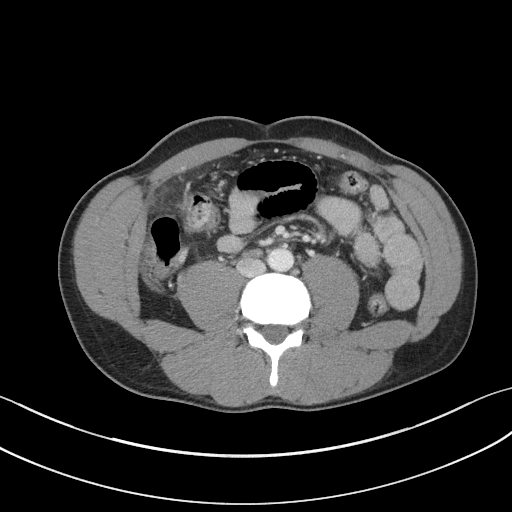
[im 64/98  soft-tissue]
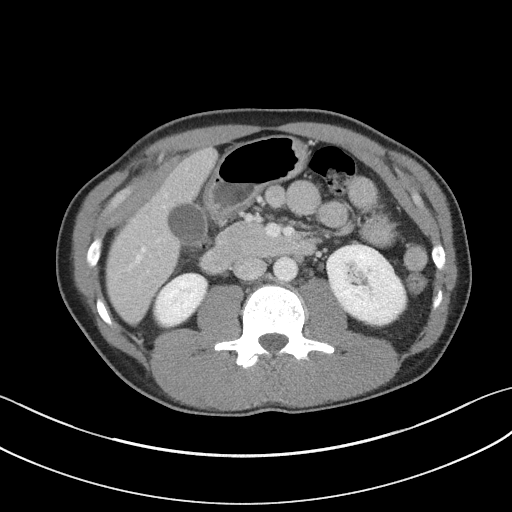
[im 64/98  bone]
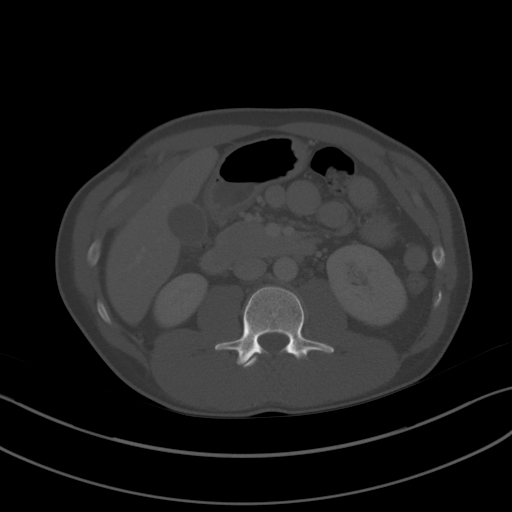
[im 72/98  soft-tissue]
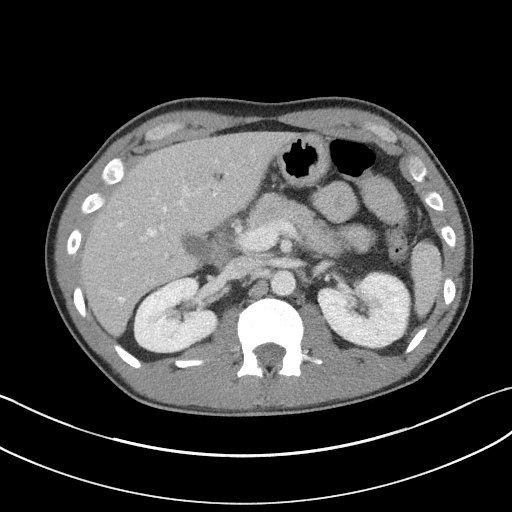
[im 76/98  soft-tissue]
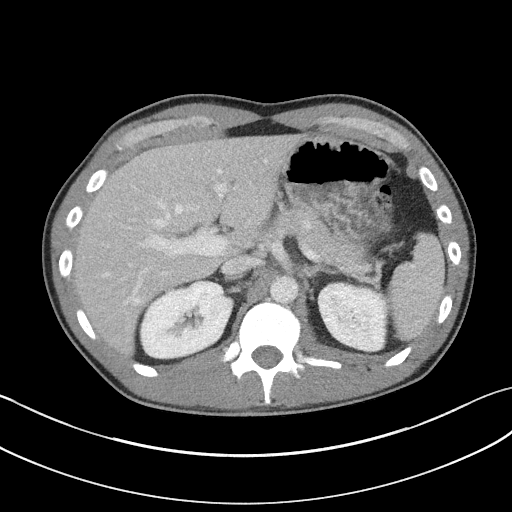
[im 85/98  soft-tissue]
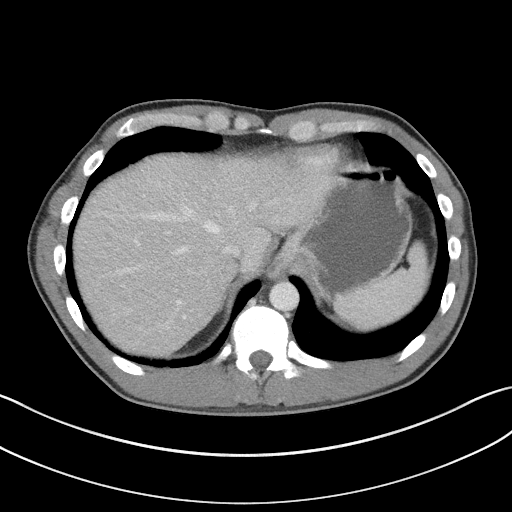
[im 93/98  soft-tissue]
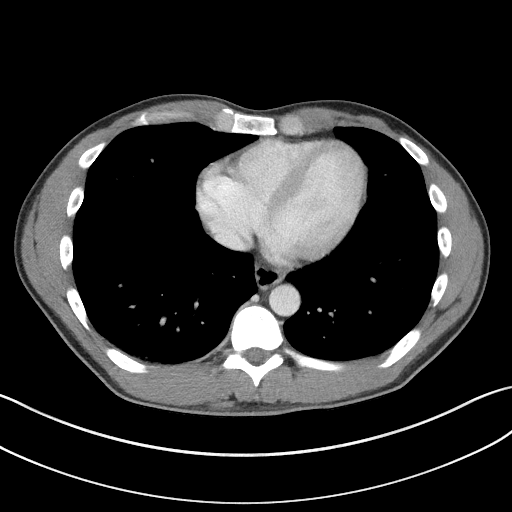

[Series 5: coronal st · coronal · 0.79mm/px · 3 of 82 slices shown]
[im 28/82  soft-tissue]
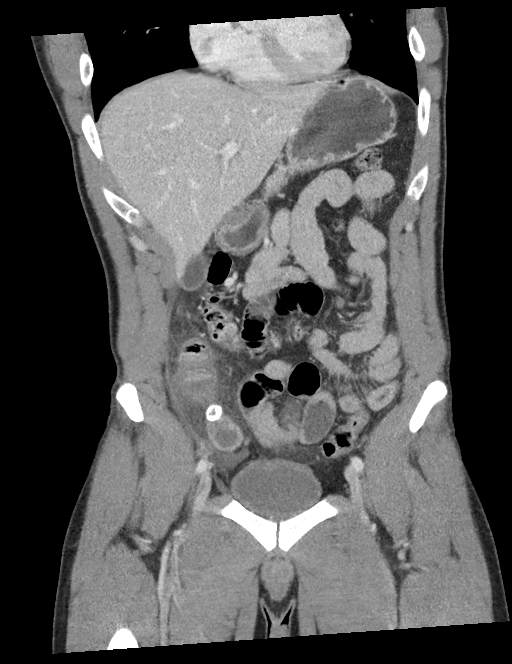
[im 37/82  soft-tissue]
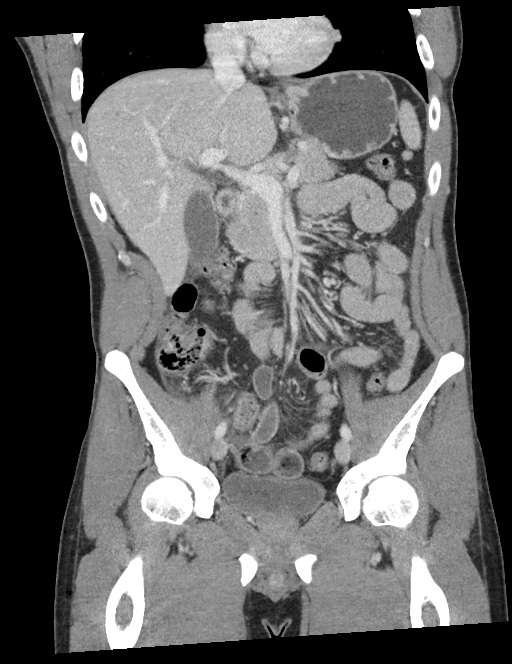
[im 46/82  soft-tissue]
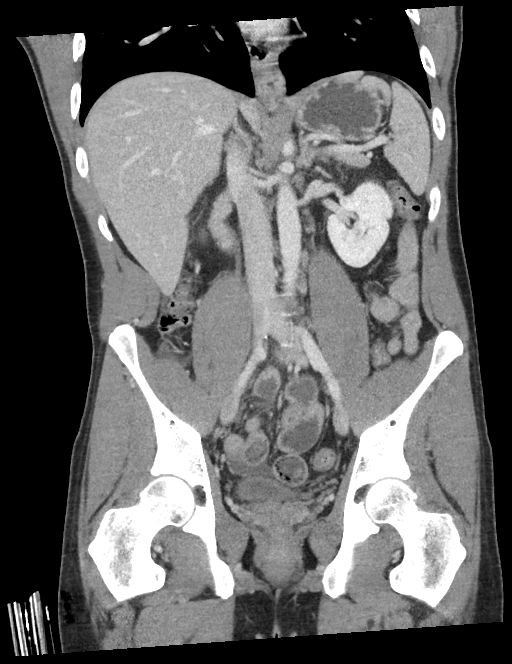

[16 of 46 positions shown; findings below may reference images not displayed]

FINDINGS: Lower chest: No acute abnormality.  Small hiatal hernia.

Hepatobiliary: No focal liver abnormality is seen. No gallstones,
gallbladder wall thickening, or biliary dilatation.

Pancreas: Unremarkable

Spleen: Unremarkable

Adrenals/Urinary Tract: Adrenal glands are unremarkable. Kidneys are
normal, without renal calculi, focal lesion, or hydronephrosis.
Bladder is unremarkable.

Stomach/Bowel: The appendix is dilated, fluid-filled, and hyperemic,
measuring up to 26 mm in diameter at the base. A 19 mm
appendicoliths seen within the base of the appendix. There is
moderate periappendiceal inflammatory stranding surrounding the base
of the appendix. There is adjacent bowel wall thickening involving
the cecum adjacent to the base of the appendix, likely reactive.
There is no periappendiceal fluid collection identified to suggest
perforation. There is, however, mild free fluid seen within the
pelvis. No free intraperitoneal gas.

The stomach, small bowel, and large bowel are otherwise
unremarkable. No evidence of obstruction.

Vascular/Lymphatic: No significant vascular findings are present. No
enlarged abdominal or pelvic lymph nodes.

Reproductive: Prostate is unremarkable.

Other: No abdominal wall hernia identified.  Rectum unremarkable.

Musculoskeletal: No acute bone abnormality. No lytic or blastic bone
lesion identified.
IMPRESSION: Acute, unruptured appendicitis.

Appendix: Location: Inferomedial to cecum

Diameter: 26 mm

Appendicolith: Present, 19 mm

Mucosal hyper-enhancement: Present

Extraluminal gas: None

Periappendiceal collection: None
